# Patient Record
Sex: Female | Born: 1955 | Race: Black or African American | Hispanic: No | Smoking: Former smoker
Health system: Southern US, Community
[De-identification: ages and names within clinical notes are randomized; demographics above are authoritative.]

## PROBLEM LIST (undated history)

## (undated) DIAGNOSIS — I2510 Atherosclerotic heart disease of native coronary artery without angina pectoris: Secondary | ICD-10-CM

## (undated) DIAGNOSIS — I10 Essential (primary) hypertension: Secondary | ICD-10-CM

## (undated) DIAGNOSIS — E785 Hyperlipidemia, unspecified: Secondary | ICD-10-CM

## (undated) DIAGNOSIS — I499 Cardiac arrhythmia, unspecified: Secondary | ICD-10-CM

## (undated) DIAGNOSIS — I509 Heart failure, unspecified: Secondary | ICD-10-CM

## (undated) HISTORY — DX: Atherosclerotic heart disease of native coronary artery without angina pectoris: I25.10

## (undated) HISTORY — DX: Essential (primary) hypertension: I10

## (undated) HISTORY — DX: Heart failure, unspecified: I50.9

## (undated) HISTORY — DX: Cardiac arrhythmia, unspecified: I49.9

## (undated) HISTORY — DX: Hyperlipidemia, unspecified: E78.5

---

## 2017-11-19 HISTORY — PX: BIV ICD INSERTION CRT-D: EP1195

## 2017-12-31 ENCOUNTER — Encounter: Payer: Self-pay | Admitting: Cardiovascular Disease

## 2017-12-31 ENCOUNTER — Ambulatory Visit: Payer: Medicare HMO | Admitting: Cardiovascular Disease

## 2017-12-31 ENCOUNTER — Encounter: Payer: Self-pay | Admitting: *Deleted

## 2017-12-31 VITALS — BP 138/88 | HR 60 | Ht 63.0 in | Wt 211.0 lb

## 2017-12-31 DIAGNOSIS — J449 Chronic obstructive pulmonary disease, unspecified: Secondary | ICD-10-CM | POA: Diagnosis not present

## 2017-12-31 DIAGNOSIS — Z6837 Body mass index (BMI) 37.0-37.9, adult: Secondary | ICD-10-CM

## 2017-12-31 DIAGNOSIS — I10 Essential (primary) hypertension: Secondary | ICD-10-CM

## 2017-12-31 DIAGNOSIS — Z95810 Presence of automatic (implantable) cardiac defibrillator: Secondary | ICD-10-CM

## 2017-12-31 DIAGNOSIS — I69354 Hemiplegia and hemiparesis following cerebral infarction affecting left non-dominant side: Secondary | ICD-10-CM

## 2017-12-31 DIAGNOSIS — I5042 Chronic combined systolic (congestive) and diastolic (congestive) heart failure: Secondary | ICD-10-CM

## 2017-12-31 DIAGNOSIS — E7800 Pure hypercholesterolemia, unspecified: Secondary | ICD-10-CM | POA: Insufficient documentation

## 2017-12-31 MED ORDER — CARVEDILOL 25 MG PO TABS: 25.0000 mg | ORAL_TABLET | Freq: Two times a day (BID) | ORAL | 3 refills | Status: DC

## 2017-12-31 MED ORDER — ATORVASTATIN CALCIUM 40 MG PO TABS: 40.0000 mg | ORAL_TABLET | Freq: Every day | ORAL | 3 refills | Status: DC

## 2017-12-31 MED ORDER — ATORVASTATIN CALCIUM 40 MG PO TABS: 40.0000 mg | ORAL_TABLET | Freq: Every day | ORAL | 0 refills | Status: AC

## 2017-12-31 MED ORDER — HYDRALAZINE HCL 25 MG PO TABS: 25.0000 mg | ORAL_TABLET | Freq: Three times a day (TID) | ORAL | 0 refills | Status: DC

## 2017-12-31 MED ORDER — FUROSEMIDE 40 MG PO TABS: 40.0000 mg | ORAL_TABLET | Freq: Every day | ORAL | 3 refills | Status: DC

## 2017-12-31 MED ORDER — HYDRALAZINE HCL 25 MG PO TABS: 25.0000 mg | ORAL_TABLET | Freq: Three times a day (TID) | ORAL | 3 refills | Status: DC

## 2017-12-31 MED ORDER — CARVEDILOL 25 MG PO TABS: 25.0000 mg | ORAL_TABLET | Freq: Two times a day (BID) | ORAL | 0 refills | Status: DC

## 2017-12-31 MED ORDER — FUROSEMIDE 40 MG PO TABS: 40.0000 mg | ORAL_TABLET | Freq: Every day | ORAL | 0 refills | Status: AC

## 2017-12-31 NOTE — Progress Notes (Signed)
Cardiology consultation Note:    Date:  12/31/2017   ID:  Raven Ross, DOB 06-13-1956, MRN [id number]  PCP:  Renaye Rakers, MD  Cardiologist:  Thurmon Fair, MD   Raven Ross is a 62 y.o. female who is being seen today for the evaluation of congestive heart failure and to establish CRT-D device followup, at the request of Dr. Parke Simmers.   History of Present Illness:    Raven Ross is a 62 y.o. female with a hx of severe cardiomyopathy, congestive heart failure, now roughly 2 months following implantation of a biventricular pacemaker/defibrillator for primary prevention. She has moved to [place] from [place]. Unfortunately we do not yet have any of her medical records from her previous cardiologist, Dr. Kern Alberta. 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 She underwent cardiac catheterization but reportedly did not have any significant coronary atherosclerosis. She was told that she had a "tiny hole in her atrial septum" on TEE and that she may have had a paradoxical embolism. She had severely depressed left ventricular systolic function and an LV apical thrombus. She took Coumadin "for about a year". He is no longer on anticoagulants. She reports having "a couple of TIAs and maybe one stroke": She had expressive aphasia which has resolved and had left-sided weakness; has some very mild residual left upper extremity hemiparesis, mostly a weak grasp. November she was told that her ejection fraction was down to 15% and it was recommended that she receive a biventricular defibrillator. She is not sure whether she had a left bundle branch block, but that sounds vaguely familiar.  The patient specifically denies any chest pain at rest or with usual exertion, dyspnea at rest or with exertion, orthopnea, paroxysmal  nocturnal dyspnea, syncope, palpitations, focal neurological deficits, intermittent claudication, lower extremity edema, unexplained weight gain, cough, hemoptysis or wheezing. Appears to have NYHA functional class I-II status  The patient also denies abdominal pain, nausea, vomiting, dysphagia, diarrhea, constipation, polyuria, polydipsia, dysuria, hematuria, frequency, urgency, abnormal bleeding or bruising, fever, chills, unexpected weight changes, mood swings, change in skin or hair texture, change in voice quality, auditory or visual problems, allergic reactions or rashes, new musculoskeletal complaints other than usual "aches and pains".  She ran out of carvedilol about 2 days ago. She believes that's why her blood pressure is high. She has had problems with hypotension from heart failure medications in the past.  Her device was implanted in December and this is essentially her first follow-up visit since then.  Device is St. Jude Quadra Assura MP model [id number] CRT-D serial number [id number]. The atrial lead is a St. Jude tendril STS 2088 TC/52 cm, serial number [id number]. The right ventricular lead is a St. Jude Durata 7122Q/65 cm serial number [id number]  The left ventricular lead is a St. Jude quartet 1458Q/86 cm, serial number BPP [id number]  RA pacing threshold 0.5 V at 0.5 ms pulse width, sensed P waves greater than 5 mV, impedance 410 ohms RV pacing threshold is 0.5 V at 0.5 ms pulse width, sensed R waves greater than 12 mV, impedance 460 ohms, high-voltage impedance 54 ohms LV pacing threshold was changed today to auto capture. Previously reported at 2.25 V at 0.5 ms pulse width similar value detected today (configuration M2-RV coil). LV lead impedance 660 ohms  The generator is at beginning  of life and (after adjustment in pacemaker lead outputs today) the anticipated generator longevity is around 5 years. The device is programmed DDD with a lower rate limit of 60 bpm and a max sensor rate  of 120/max track rate of 120 bpm. Paced/sensed AV delays are scheduled at 150/100 ms. She has 98% ventricular pacing. There is also 36% atrial pacing with a favorable heart rate histogram distribution.   Tachycardia detection is set in 2 zones, a VT zone at 181 bpm and a VF zone at 200 bpm. VT detection is set for conservatively at 40 intervals were as VF detection set at 20 intervals. 2 runs of ATP programmed in the VT zone followed by shocks. In the VF zone there is ATP during charge is followed by shock. SVT discrimination appears to be programmed appropriately.  There has been no recording of atrial fibrillation and no ventricular tachycardia. The thoracic impedance monitoring was turned on at this visit.  Additional noncardiac problems include a history of hepatitis C,  "cured" with harvoni.  She is a widow after the death of her first husband in 2008, but has a "significant other" for the last 10 years and he has moved with her to [place]. 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 She is interested in going back to work at least part-time.  Past Medical History:  Diagnosis Date  . Arrhythmia   . CHF (congestive heart failure) (HCC)   . Coronary artery disease   . Hyperlipidemia   . Hypertension     Past Surgical History:  Procedure Laterality Date  . BIV ICD INSERTION CRT-D Left 11/19/2017   [place], Dr. Kern AlbertaSwitzer    Current Medications: Current Meds  Medication Sig  . aspirin EC 81 MG tablet Take 81 mg by mouth daily.  Marland Kitchen. atorvastatin (LIPITOR) 40 MG tablet Take 1 tablet (40 mg total) by mouth daily.  . budesonide-formoterol (SYMBICORT) 160-4.5 MCG/ACT inhaler Inhale 2 puffs into the lungs 2 (two) times daily.  . carvedilol (COREG) 25 MG tablet Take 1 tablet (25 mg total) by mouth 2 (two) times daily with a meal.  . furosemide (LASIX) 40 MG tablet Take 1 tablet (40 mg total) by mouth daily.  . hydrALAZINE (APRESOLINE) 25 MG tablet Take 1 tablet (25 mg total)  by mouth 3 (three) times daily.  . Isosorbide Mononitrate (IMDUR PO) Take 10 mg by mouth 3 (three) times daily.   . sacubitril-valsartan (ENTRESTO) 24-26 MG Take 1 tablet by mouth 2 (two) times daily.  . [DISCONTINUED] atorvastatin (LIPITOR) 40 MG tablet Take 40 mg by mouth daily.  . [DISCONTINUED] atorvastatin (LIPITOR) 40 MG tablet Take 1 tablet (40 mg total) by mouth daily.  . [DISCONTINUED] carvedilol (COREG) 25 MG tablet Take 25 mg by mouth 2 (two) times daily with a meal.  . [DISCONTINUED] carvedilol (COREG) 25 MG tablet Take 1 tablet (25 mg total) by mouth 2 (two) times daily with a meal.  . [DISCONTINUED] furosemide (LASIX) 40 MG tablet Take 40 mg by mouth daily.  . [DISCONTINUED] furosemide (LASIX) 40 MG tablet Take 1 tablet (40 mg total) by mouth daily.  . [DISCONTINUED] hydrALAZINE (APRESOLINE) 25 MG tablet Take 25 mg by mouth 3 (three) times daily.  . [DISCONTINUED] hydrALAZINE (APRESOLINE) 25 MG tablet Take 1 tablet (25 mg total) by mouth 3 (three) times daily.     Allergies:   Patient has no known allergies.   Social History   Socioeconomic History  . Marital status:  Significant Other    Spouse name: None  . Number of children: None  . Years of education: None  . Highest education level: None  Social Needs  . Financial resource strain: None  . Food insecurity - worry: None  . Food insecurity - inability: None  . Transportation needs - medical: None  . Transportation needs - non-medical: None  Occupational History  . None  Tobacco Use  . Smoking status: Former Games developer  . Smokeless tobacco: Never Used  Substance and Sexual Activity  . Alcohol use: No    Frequency: Never  . Drug use: No  . Sexual activity: None  Other Topics Concern  . None  Social History Narrative  . None     Family History: The patient's family history includes COPD in her father and mother; Dementia in her mother; Diabetes in her father; Emphysema in her father.  ROS:   Please see the  history of present illness.     All other systems reviewed and are negative.  EKGs/Labs/Other Studies Reviewed:    EKG:  EKG is  ordered today.  The ekg ordered today demonstrates atrial paced, biventricular paced rhythm. QRS on and 22 ms with fairly brisk sharp R waves in leads V1-V2, QTC 490 ms  Recent Labs: No results found for requested labs within last 8760 hours.  Recent Lipid Panel No results found for: CHOL, TRIG, HDL, CHOLHDL, VLDL, LDLCALC, LDLDIRECT  Physical Exam:    VS:  BP 138/88   Pulse 60   Ht 5\' 3"  (1.6 m)   Wt 211 lb (95.7 kg)   BMI 37.38 kg/m     Wt Readings from Last 3 Encounters:  12/31/17 211 lb (95.7 kg)     GEN: Moderately obese Well nourished, well developed in no acute distress HEENT: Normal NECK: No JVD; No carotid bruits LYMPHATICS: No lymphadenopathy CARDIAC: Paradoxically split second heart sound, RRR, no murmurs, rubs, gallops, healthy healed scar in the left subclavian area, healthy defibrillator pocket RESPIRATORY:  Clear to auscultation without rales, wheezing or rhonchi  ABDOMEN: Soft, non-tender, non-distended MUSCULOSKELETAL:  No edema; No deformity  SKIN: Warm and dry NEUROLOGIC:  Alert and oriented x 3. Just barely detectable difference in grasp strength between her right upper extremity praxis stronger and left upper extremity (a little weaker). PSYCHIATRIC:  Normal affect   ASSESSMENT:    1. Chronic combined systolic and diastolic congestive heart failure (HCC)   2. Biventricular ICD (implantable cardioverter-defibrillator) St. Jude   3. Essential hypertension   4. Hemiparesis affecting left side as late effect of cerebrovascular accident (HCC)   5. Class 2 severe obesity due to excess calories with serious comorbidity and body mass index (BMI) of 37.0 to 37.9 in adult (HCC)   6. Hypercholesterolemia   7. Chronic obstructive pulmonary disease, unspecified COPD type (HCC)    PLAN:    In order of problems listed above:  1. CHF:  Seems to have functional class I-II status. Clinically euvolemic as far as I can tell. Uncertain what her current "dry weight" is. She has not weight at home on a regular basis since moving from [place]. She admits to having a weakness first salt but understands the need to avoid added salt and salt rich foods. Recommended that she start daily weights. Her current medical regimen is appropriate, but I told her that she needs to be cautious in the future to avoid "running out" of her heart failure medications and that this is particularly true of carvedilol.  His [place] risk of rebound arrhythmia/hypertension/heart failure with abrupt interruption of beta blockers. We'll continue this current dose of loop diuretic at least for the time being. After she restarts her carvedilol will bring her back to see one of our clinical pharmacists to try to titrate up the dose of Entresto. If necessary we will discontinue her hydralazine and isosorbide, since I believe the benefit from [facility] is greater. Plan to recheck an echocardiogram in about 3 months, after CRT and optimization of medical therapy. Refilled all prescriptions today. 2. CRT-D: Reduced the output on her right atrial and right ventricular leads to 2.0 V at 0.5 ms today. Turned auto capture on her left ventricular lead. Corvue also turned on. Reviewed her "shock plan". Demonstrated the device vibratory alert  Plan remote monitoring and will refer to device clinic. She does not have a transmitter and we will contact industry to obtain one. Will bring her back to the device clinic in 3 months first to be sure we have good follow-up and to see if there is any option for optimizing left ventricular lead output. I'm not sure why a VT zone with therapy was programmed in this patient. It's possible that she had some previously detected VT. We'll leave the tachycardia detection settings and treatment settings unchanged until we get some medical records. 3. HTN: Blood  pressure slightly high today, but she reports problems with hypotension when her medicines are being titrated before. Prioritize use of carvedilol and Entresto. 4. History of CVA: I'm a little puzzled by the decision to stop the use of anticoagulation in a patient that has reportedly had a few neurological embolic events, has severely depressed left ventricular systolic function and a remote history of left ventricular thrombus, especially since there is also a mention of a paradoxical embolism as a possible cause for her initial myocardial infarction. Need to get her previous medical records before making a decision. 5. Obesity: Discussed the rather paradoxical relationship between obesity and heart failure, but I believe that she would benefit from weight loss. She does not have signs or symptoms of obstructive sleep apnea. 6. HLP: On statin. She plans to have all her labs performed at an upcoming appointment with Dr. Parke Simmers. 7. COPD: She does have a history of remote smoking. She has a prescription for Symbicort, although there is no clear diagnosis of asthma. We discussed the fact that this medication should never be taken more than the prescribed amount and should not be used as a rescue inhaler. Reviewed the potential worsening risk for ventricular arrhythmia.   Medication Adjustments/Labs and Tests Ordered: Current medicines are reviewed at length with the patient today.  Concerns regarding medicines are outlined above.  Orders Placed This Encounter  Procedures  . EKG 12-Lead  . ECHOCARDIOGRAM COMPLETE   Meds ordered this encounter  Medications  . DISCONTD: atorvastatin (LIPITOR) 40 MG tablet    Sig: Take 1 tablet (40 mg total) by mouth daily.    Dispense:  90 tablet    Refill:  3  . DISCONTD: carvedilol (COREG) 25 MG tablet    Sig: Take 1 tablet (25 mg total) by mouth 2 (two) times daily with a meal.    Dispense:  180 tablet    Refill:  3  . DISCONTD: furosemide (LASIX) 40 MG tablet     Sig: Take 1 tablet (40 mg total) by mouth daily.    Dispense:  90 tablet    Refill:  3  . DISCONTD: hydrALAZINE (APRESOLINE) 25  MG tablet    Sig: Take 1 tablet (25 mg total) by mouth 3 (three) times daily.    Dispense:  270 tablet    Refill:  3  . atorvastatin (LIPITOR) 40 MG tablet    Sig: Take 1 tablet (40 mg total) by mouth daily.    Dispense:  30 tablet    Refill:  0  . carvedilol (COREG) 25 MG tablet    Sig: Take 1 tablet (25 mg total) by mouth 2 (two) times daily with a meal.    Dispense:  60 tablet    Refill:  0  . furosemide (LASIX) 40 MG tablet    Sig: Take 1 tablet (40 mg total) by mouth daily.    Dispense:  30 tablet    Refill:  0  . hydrALAZINE (APRESOLINE) 25 MG tablet    Sig: Take 1 tablet (25 mg total) by mouth 3 (three) times daily.    Dispense:  90 tablet    Refill:  0    Signed, Thurmon Fair, MD  12/31/2017 8:10 PM    [facility]

## 2017-12-31 NOTE — Patient Instructions (Signed)
Dr Royann Shiversroitoru recommends that you continue on your current medications as directed. Please refer to the Current Medication list given to you today.  Your physician recommends that you schedule an appointment in 1-2 weeks with one of our clinical pharmacists for medication managment.  Your physician has requested that you have an echocardiogram. Echocardiography is a painless test that uses sound waves to create images of your heart. It provides your doctor with information about the size and shape of your heart and how well your heart's chambers and valves are working. This procedure takes approximately one hour. There are no restrictions for this procedure. >>[facility] [address], [address] [address]  Your physician recommends that you schedule a follow-up appointment in 3 months at [facility]. I will have the nurses mail you a transmitter  Dr Royann Shiversroitoru recommends that you schedule a follow-up appointment in 6 months with an ICD check. You will receive a reminder letter in the mail two months in advance. If you don't receive a letter, please call our office to schedule the follow-up appointment.  If you need a refill on your cardiac medications before your next appointment, please call your pharmacy.

## 2018-01-03 DIAGNOSIS — I509 Heart failure, unspecified: Secondary | ICD-10-CM | POA: Diagnosis not present

## 2018-01-03 DIAGNOSIS — E118 Type 2 diabetes mellitus with unspecified complications: Secondary | ICD-10-CM | POA: Diagnosis not present

## 2018-01-14 ENCOUNTER — Telehealth: Payer: Self-pay | Admitting: Cardiovascular Disease

## 2018-01-14 NOTE — Telephone Encounter (Signed)
Received incoming records from [facility] Starr SinclairKenneth L. Gayles MD. Records placed in Dr. Erin Hearingroitoru's box.

## 2018-01-16 DIAGNOSIS — F5102 Adjustment insomnia: Secondary | ICD-10-CM | POA: Diagnosis not present

## 2018-01-16 DIAGNOSIS — F3289 Other specified depressive episodes: Secondary | ICD-10-CM | POA: Diagnosis not present

## 2018-01-16 DIAGNOSIS — I509 Heart failure, unspecified: Secondary | ICD-10-CM | POA: Diagnosis not present

## 2018-01-20 ENCOUNTER — Telehealth: Payer: Self-pay | Admitting: Cardiovascular Disease

## 2018-01-20 ENCOUNTER — Other Ambulatory Visit: Payer: Self-pay | Admitting: Cardiovascular Disease

## 2018-01-20 MED ORDER — ISOSORBIDE DINITRATE 10 MG PO TABS: 10.0000 mg | ORAL_TABLET | Freq: Three times a day (TID) | ORAL | 1 refills | Status: DC

## 2018-01-20 NOTE — Telephone Encounter (Signed)
Rx request sent to pharmacy.  

## 2018-01-20 NOTE — Telephone Encounter (Signed)
Isosorbide dinitrate 10 mg TID is preferable in CHF, but can be more expensive.  If cost is prohibitive, OK to switch to isosorbide mononitrate 30 mg daily MCr

## 2018-01-20 NOTE — Telephone Encounter (Signed)
Rx(s) sent to pharmacy electronically.  

## 2018-01-20 NOTE — Telephone Encounter (Signed)
°*  STAT* If patient is at the pharmacy, call can be transferred to refill team.   1. Which medications need to be refilled? (please list name of each medication and dose if known) Isosorbide  2. Which pharmacy/location (including street and city if local pharmacy) is medication to be sent to?[facility],[place]  3. Do they need a 30 day or 90 day supply?#90-for a month supply until [facility]

## 2018-02-12 ENCOUNTER — Other Ambulatory Visit (HOSPITAL_COMMUNITY): Payer: Medicare HMO

## 2018-02-23 ENCOUNTER — Encounter (HOSPITAL_COMMUNITY): Payer: Self-pay | Admitting: Oncology

## 2018-02-23 ENCOUNTER — Emergency Department (HOSPITAL_COMMUNITY)
Admission: EM | Admit: 2018-02-23 | Discharge: 2018-02-24 | Disposition: A | Discharge status: Home / Self Care | Payer: Medicare HMO | Attending: Emergency Medicine | Admitting: Emergency Medicine

## 2018-02-23 DIAGNOSIS — I5042 Chronic combined systolic (congestive) and diastolic (congestive) heart failure: Secondary | ICD-10-CM | POA: Insufficient documentation

## 2018-02-23 DIAGNOSIS — Z95810 Presence of automatic (implantable) cardiac defibrillator: Secondary | ICD-10-CM | POA: Insufficient documentation

## 2018-02-23 DIAGNOSIS — Z7982 Long term (current) use of aspirin: Secondary | ICD-10-CM | POA: Diagnosis not present

## 2018-02-23 DIAGNOSIS — R000 Tachycardia, unspecified: Secondary | ICD-10-CM | POA: Diagnosis not present

## 2018-02-23 DIAGNOSIS — R0602 Shortness of breath: Secondary | ICD-10-CM | POA: Diagnosis not present

## 2018-02-23 DIAGNOSIS — R05 Cough: Secondary | ICD-10-CM | POA: Diagnosis not present

## 2018-02-23 DIAGNOSIS — R600 Localized edema: Secondary | ICD-10-CM | POA: Diagnosis not present

## 2018-02-23 DIAGNOSIS — I259 Chronic ischemic heart disease, unspecified: Secondary | ICD-10-CM | POA: Diagnosis not present

## 2018-02-23 DIAGNOSIS — R0600 Dyspnea, unspecified: Secondary | ICD-10-CM

## 2018-02-23 DIAGNOSIS — J441 Chronic obstructive pulmonary disease with (acute) exacerbation: Secondary | ICD-10-CM | POA: Insufficient documentation

## 2018-02-23 DIAGNOSIS — Z720 Tobacco use: Secondary | ICD-10-CM | POA: Diagnosis not present

## 2018-02-23 DIAGNOSIS — I110 Hypertensive heart disease with heart failure: Secondary | ICD-10-CM | POA: Diagnosis not present

## 2018-02-23 DIAGNOSIS — R69 Illness, unspecified: Secondary | ICD-10-CM

## 2018-02-23 DIAGNOSIS — M7989 Other specified soft tissue disorders: Secondary | ICD-10-CM | POA: Diagnosis not present

## 2018-02-23 DIAGNOSIS — F99 Mental disorder, not otherwise specified: Secondary | ICD-10-CM | POA: Diagnosis not present

## 2018-02-23 DIAGNOSIS — Z79899 Other long term (current) drug therapy: Secondary | ICD-10-CM | POA: Diagnosis not present

## 2018-02-23 DIAGNOSIS — K429 Umbilical hernia without obstruction or gangrene: Secondary | ICD-10-CM | POA: Insufficient documentation

## 2018-02-23 MED ORDER — ALBUTEROL SULFATE (2.5 MG/3ML) 0.083% IN NEBU: 5.0000 mg | INHALATION_SOLUTION | Freq: Once | RESPIRATORY_TRACT | Status: DC

## 2018-02-23 NOTE — ED Triage Notes (Signed)
Pt bib [facility] from home d/t shob x 4 days.  Pt reported developing a cough w/ clear sputum that has gotten worse today.  Pt reported to EMS that when this has happened in the past she has needed a paracentesis.  EMS reports that abdomen is distended.  Pt placed on 2L O2 by EMS for comfort.

## 2018-02-24 ENCOUNTER — Other Ambulatory Visit (HOSPITAL_COMMUNITY): Payer: Medicare HMO

## 2018-02-24 ENCOUNTER — Encounter (HOSPITAL_COMMUNITY): Payer: Self-pay | Admitting: Radiology

## 2018-02-24 ENCOUNTER — Emergency Department (HOSPITAL_COMMUNITY): Payer: Medicare HMO

## 2018-02-24 DIAGNOSIS — R0602 Shortness of breath: Secondary | ICD-10-CM | POA: Diagnosis not present

## 2018-02-24 DIAGNOSIS — K429 Umbilical hernia without obstruction or gangrene: Secondary | ICD-10-CM | POA: Diagnosis not present

## 2018-02-24 DIAGNOSIS — R05 Cough: Secondary | ICD-10-CM | POA: Diagnosis not present

## 2018-02-24 LAB — BASIC METABOLIC PANEL
Anion gap: 7 (ref 5–15)
BUN: 21 mg/dL — AB (ref 6–20)
CHLORIDE: 109 mmol/L (ref 101–111)
CO2: 24 mmol/L (ref 22–32)
CREATININE: 1.03 mg/dL — AB (ref 0.44–1.00)
Calcium: 8.5 mg/dL — ABNORMAL LOW (ref 8.9–10.3)
GFR calc Af Amer: >60 mL/min (ref >60)
GFR, EST NON AFRICAN AMERICAN: 58 mL/min — AB (ref >60)
Glucose, Bld: 107 mg/dL — ABNORMAL HIGH (ref 65–99)
POTASSIUM: 4.8 mmol/L (ref 3.5–5.1)
SODIUM: 140 mmol/L (ref 135–145)

## 2018-02-24 LAB — CBC
HCT: 41.5 % (ref 36.0–46.0)
HEMOGLOBIN: 13 g/dL (ref 12.0–15.0)
MCH: 32.8 pg (ref 26.0–34.0)
MCHC: 31.3 g/dL (ref 30.0–36.0)
MCV: 104.8 fL — ABNORMAL HIGH (ref 78.0–100.0)
PLATELETS: 121 10*3/uL — AB (ref 150–400)
RBC: 3.96 MIL/uL (ref 3.87–5.11)
RDW: 15 % (ref 11.5–15.5)
WBC: 4.6 10*3/uL (ref 4.0–10.5)

## 2018-02-24 LAB — I-STAT TROPONIN, ED: Troponin i, poc: 0 ng/mL (ref 0.00–0.08)

## 2018-02-24 LAB — HEPATIC FUNCTION PANEL
ALBUMIN: 3.3 g/dL — AB (ref 3.5–5.0)
ALT: 18 U/L (ref 14–54)
ALT: 20 U/L (ref 14–54)
AST: 25 U/L (ref 15–41)
AST: 29 U/L (ref 15–41)
Albumin: 3.4 g/dL — ABNORMAL LOW (ref 3.5–5.0)
Alkaline Phosphatase: 55 U/L (ref 38–126)
Alkaline Phosphatase: 62 U/L (ref 38–126)
BILIRUBIN INDIRECT: 0.4 mg/dL (ref 0.3–0.9)
BILIRUBIN INDIRECT: 0.4 mg/dL (ref 0.3–0.9)
Bilirubin, Direct: 0.3 mg/dL (ref 0.1–0.5)
Bilirubin, Direct: 0.7 mg/dL — ABNORMAL HIGH (ref 0.1–0.5)
TOTAL PROTEIN: 6.2 g/dL — AB (ref 6.5–8.1)
TOTAL PROTEIN: 6.5 g/dL (ref 6.5–8.1)
Total Bilirubin: 0.7 mg/dL (ref 0.3–1.2)
Total Bilirubin: 1.1 mg/dL (ref 0.3–1.2)

## 2018-02-24 LAB — PROTIME-INR
INR: 1.01
PROTHROMBIN TIME: 13.3 s (ref 11.4–15.2)

## 2018-02-24 LAB — BRAIN NATRIURETIC PEPTIDE: B NATRIURETIC PEPTIDE 5: 251.5 pg/mL — AB (ref 0.0–100.0)

## 2018-02-24 LAB — AMMONIA: Ammonia: 53 umol/L — ABNORMAL HIGH (ref 9–35)

## 2018-02-24 LAB — I-STAT CG4 LACTIC ACID, ED: LACTIC ACID, VENOUS: 0.92 mmol/L (ref 0.5–1.9)

## 2018-02-24 MED ORDER — ISOSORBIDE DINITRATE 10 MG PO TABS
10.0000 mg | ORAL_TABLET | Freq: Once | ORAL | Status: AC
  Administered 2018-02-24: 10 mg via ORAL
  Filled 2018-02-24: qty 1

## 2018-02-24 MED ORDER — ASPIRIN 81 MG PO CHEW
81.0000 mg | CHEWABLE_TABLET | Freq: Once | ORAL | Status: AC
  Administered 2018-02-24: 81 mg via ORAL
  Filled 2018-02-24: qty 1

## 2018-02-24 MED ORDER — PREDNISONE 20 MG PO TABS
40.0000 mg | ORAL_TABLET | Freq: Once | ORAL | Status: AC
Start: 2018-02-24 — End: 2018-02-24
  Administered 2018-02-24: 40 mg via ORAL
  Filled 2018-02-24: qty 2

## 2018-02-24 MED ORDER — AEROCHAMBER PLUS W/MASK MISC: 2 refills | Status: AC

## 2018-02-24 MED ORDER — HYDRALAZINE HCL 25 MG PO TABS
25.0000 mg | ORAL_TABLET | Freq: Once | ORAL | Status: AC
  Administered 2018-02-24: 25 mg via ORAL
  Filled 2018-02-24: qty 1

## 2018-02-24 MED ORDER — IPRATROPIUM BROMIDE HFA 17 MCG/ACT IN AERS: 2.0000 | INHALATION_SPRAY | Freq: Four times a day (QID) | RESPIRATORY_TRACT | 12 refills | Status: AC | PRN

## 2018-02-24 MED ORDER — IPRATROPIUM-ALBUTEROL 0.5-2.5 (3) MG/3ML IN SOLN
3.0000 mL | Freq: Once | RESPIRATORY_TRACT | Status: AC
  Administered 2018-02-24: 3 mL via RESPIRATORY_TRACT
  Filled 2018-02-24: qty 3

## 2018-02-24 MED ORDER — PREDNISONE 20 MG PO TABS: ORAL_TABLET | ORAL | 0 refills | Status: DC

## 2018-02-24 MED ORDER — FUROSEMIDE 10 MG/ML IJ SOLN
60.0000 mg | Freq: Once | INTRAMUSCULAR | Status: AC
  Administered 2018-02-24: 60 mg via INTRAVENOUS
  Filled 2018-02-24: qty 6

## 2018-02-24 MED ORDER — SACUBITRIL-VALSARTAN 24-26 MG PO TABS
1.0000 | ORAL_TABLET | Freq: Once | ORAL | Status: AC
  Administered 2018-02-24: 1 via ORAL
  Filled 2018-02-24: qty 1

## 2018-02-24 MED ORDER — ALBUTEROL SULFATE HFA 108 (90 BASE) MCG/ACT IN AERS: 1.0000 | INHALATION_SPRAY | Freq: Four times a day (QID) | RESPIRATORY_TRACT | 0 refills | Status: AC | PRN

## 2018-02-24 MED ORDER — IOPAMIDOL (ISOVUE-370) INJECTION 76%
INTRAVENOUS | Status: AC
  Administered 2018-02-24: 100 mL
  Filled 2018-02-24: qty 100

## 2018-02-24 MED ORDER — CARVEDILOL 12.5 MG PO TABS
25.0000 mg | ORAL_TABLET | Freq: Two times a day (BID) | ORAL | Status: DC
  Administered 2018-02-24: 25 mg via ORAL
  Filled 2018-02-24: qty 2

## 2018-02-24 NOTE — ED Notes (Signed)
CT called and advised that the pts creatine had not resulted.   Contacted the lab and they said they "lost" her blood and could not run what they had down there for her.

## 2018-02-24 NOTE — ED Notes (Signed)
Heart Healthy Breakfast Ordered @ 0942-per RN-called by Marylene LandAngela

## 2018-02-24 NOTE — ED Notes (Signed)
Patient transported to X-ray 

## 2018-02-24 NOTE — ED Notes (Signed)
Patient ambulated to restroom without difficulty. No respiratory distress noted.

## 2018-02-24 NOTE — ED Notes (Signed)
Patient transported to CT 

## 2018-02-24 NOTE — ED Provider Notes (Signed)
[facility] EMERGENCY DEPARTMENT Provider Note   CSN: [id number] Arrival date & time: 02/23/18  2351     History   Chief Complaint Chief Complaint  Patient presents with  . Shortness of Breath    HPI Raven Ross is a 62 y.o. female.  Patient presents to the emergency department for evaluation of shortness of breath.  She reports that she has had progressive worsening of her breathing over the last 3 or 4 days.  This has been in conjunction with a productive cough.  She is unaware of any fever.  She has not experience any chest pain.  Patient reports a previous history of heart failure, does not weigh herself daily, but does feel like she is more swollen than usual.      Past Medical History:  Diagnosis Date  . Arrhythmia   . CHF (congestive heart failure) (HCC)   . Coronary artery disease   . Hyperlipidemia   . Hypertension     Patient Active Problem List   Diagnosis Date Noted  . Chronic combined systolic and diastolic congestive heart failure (HCC) 12/31/2017  . Biventricular ICD (implantable cardioverter-defibrillator) [employer] Jude 12/31/2017  . Essential hypertension 12/31/2017  . Hemiparesis affecting left side as late effect of cerebrovascular accident (HCC) 12/31/2017  . Class 2 severe obesity due to excess calories with serious comorbidity and body mass index (BMI) of 37.0 to 37.9 in adult (HCC) 12/31/2017  . Hypercholesterolemia 12/31/2017  . Chronic obstructive pulmonary disease (HCC) 12/31/2017    Past Surgical History:  Procedure Laterality Date  . BIV ICD INSERTION CRT-D Left 11/19/2017   [place], [place], Dr. Kern Alberta    OB History    No data available       Home Medications    Prior to Admission medications   Medication Sig Start Date End Date Taking? Authorizing Provider  aspirin EC 81 MG tablet Take 81 mg by mouth daily.   Yes [provider]  atorvastatin (LIPITOR) 40 MG tablet Take 1 tablet (40 mg total) by mouth  daily. 12/31/17  Yes Croitoru, Mihai, MD  budesonide-formoterol (SYMBICORT) 160-4.5 MCG/ACT inhaler Inhale 2 puffs into the lungs 2 (two) times daily.   Yes [provider]  carvedilol (COREG) 25 MG tablet Take 1 tablet (25 mg total) by mouth 2 (two) times daily with a meal. 12/31/17  Yes Croitoru, Mihai, MD  furosemide (LASIX) 40 MG tablet Take 1 tablet (40 mg total) by mouth daily. 12/31/17  Yes Croitoru, Mihai, MD  hydrALAZINE (APRESOLINE) 25 MG tablet Take 1 tablet (25 mg total) by mouth 3 (three) times daily. 12/31/17  Yes Croitoru, Mihai, MD  isosorbide dinitrate (ISORDIL) 10 MG tablet TAKE 1 TABLET BY MOUTH THREE TIMES DAILY 01/20/18  Yes Croitoru, Mihai, MD  PROAIR HFA 108 (90 Base) MCG/ACT inhaler Inhale 1-2 puffs into the lungs every 4 (four) hours as needed for wheezing.  12/31/17  Yes [provider]  sacubitril-valsartan (ENTRESTO) 24-26 MG Take 1 tablet by mouth 2 (two) times daily.   Yes [provider]    Family History Family History  Problem Relation Age of Onset  . COPD Mother   . Dementia Mother   . COPD Father   . Emphysema Father   . Diabetes Father     Social History Social History   Tobacco Use  . Smoking status: Former Games developer  . Smokeless tobacco: Never Used  Substance Use Topics  . Alcohol use: No    Frequency: Never  .  Drug use: No     Allergies   Patient has no known allergies.   Review of Systems Review of Systems  Respiratory: Positive for shortness of breath.   Cardiovascular: Positive for leg swelling.  All other systems reviewed and are negative.    Physical Exam Updated Vital Signs BP 129/82   Pulse 77   Temp (!) 97.5 F (36.4 C) (Oral)   Resp 19   Ht 5\' 3"  (1.6 m)   Wt 93.4 kg (206 lb)   SpO2 91%   BMI 36.49 kg/m   Physical Exam  Constitutional: She is oriented to person, place, and time. She appears well-developed and well-nourished. No distress.  HENT:  Head: Normocephalic and atraumatic.  Right  Ear: Hearing normal.  Left Ear: Hearing normal.  Nose: Nose normal.  Mouth/Throat: Oropharynx is clear and moist and mucous membranes are normal.  Eyes: Conjunctivae and EOM are normal. Pupils are equal, round, and reactive to light.  Neck: Normal range of motion. Neck supple.  Cardiovascular: Regular rhythm, S1 normal and S2 normal. Exam reveals no gallop and no friction rub.  No murmur heard. Pulmonary/Chest: Effort normal. No respiratory distress. She has decreased breath sounds. She exhibits no tenderness.  Abdominal: Soft. Normal appearance and bowel sounds are normal. There is no hepatosplenomegaly. There is no tenderness. There is no rebound, no guarding, no tenderness at McBurney's point and negative Murphy's sign. No hernia.  Musculoskeletal: Normal range of motion.  Neurological: She is alert and oriented to person, place, and time. She has normal strength. No cranial nerve deficit or sensory deficit. Coordination normal. GCS eye subscore is 4. GCS verbal subscore is 5. GCS motor subscore is 6.  Skin: Skin is warm, dry and intact. No rash noted. No cyanosis.  Psychiatric: She has a normal mood and affect. Her speech is normal and behavior is normal. Thought content normal.  Nursing note and vitals reviewed.    ED Treatments / Results  Labs (all labs ordered are listed, but only abnormal results are displayed) Labs Reviewed  AMMONIA - Abnormal; Notable for the following components:      Result Value   Ammonia 53 (*)    All other components within normal limits  HEPATIC FUNCTION PANEL - Abnormal; Notable for the following components:   Albumin 3.4 (*)    Bilirubin, Direct 0.7 (*)    All other components within normal limits  BRAIN NATRIURETIC PEPTIDE - Abnormal; Notable for the following components:   B Natriuretic Peptide 251.5 (*)    All other components within normal limits  CBC - Abnormal; Notable for the following components:   MCV 104.8 (*)    Platelets 121 (*)     All other components within normal limits  BASIC METABOLIC PANEL - Abnormal; Notable for the following components:   Glucose, Bld 107 (*)    BUN 21 (*)    Creatinine, Ser 1.03 (*)    Calcium 8.5 (*)    GFR calc non Af Amer 58 (*)    All other components within normal limits  PROTIME-INR  I-STAT TROPONIN, ED  I-STAT CG4 LACTIC ACID, ED    EKG  EKG Interpretation  Date/Time:  Sunday February 23 2018 23:55:23 EDT Ventricular Rate:  65 PR Interval:    QRS Duration: 240 QT Interval:  510 QTC Calculation: 531 R Axis:   -172 Text Interpretation:  Sinus or ectopic atrial rhythm Prolonged PR interval Nonspecific intraventricular conduction delay Repol abnrm, severe global ischemia (LM/MVD) No previous  tracing Confirmed by Gilda CreasePollina, Shiniqua Groseclose J [phone]) on 02/24/2018 12:14:41 AM Also confirmed by Gilda CreasePollina, Kayse Puccini J [phone]), editor Barbette HairCassel, Kerry [phone])  on 02/24/2018 7:18:48 AM       Radiology Dg Chest 2 View  Result Date: 02/24/2018 CLINICAL DATA:  Acute onset of productive cough. Shortness of breath. Abdominal distention. EXAM: CHEST - 2 VIEW COMPARISON:  None. FINDINGS: The lungs are well-aerated and clear. There is no evidence of focal opacification, pleural effusion or pneumothorax. The heart is mildly enlarged. A pacemaker/AICD is noted at the left chest wall, with leads ending at the right atrium, right ventricle and coronary sinus. No acute osseous abnormalities are seen. IMPRESSION: Mild cardiomegaly.  Lungs remain grossly clear. Electronically Signed   By: Roanna RaiderJeffery  Chang M.D.   On: 02/24/2018 00:23    Procedures Procedures (including critical care time)  Medications Ordered in ED Medications  iopamidol (ISOVUE-370) 76 % injection (not administered)  furosemide (LASIX) injection 60 mg (not administered)  carvedilol (COREG) tablet 25 mg (not administered)  hydrALAZINE (APRESOLINE) tablet 25 mg (not administered)  isosorbide dinitrate (ISORDIL) tablet 10 mg (not administered)    sacubitril-valsartan (ENTRESTO) 24-26 mg per tablet (not administered)     Initial Impression / Assessment and Plan / ED Course  I have reviewed the triage vital signs and the nursing notes.  Pertinent labs & imaging results that were available during my care of the patient were reviewed by me and considered in my medical decision making (see chart for details).     Presents to the emergency department for evaluation of dyspnea.  Patient reports that she has had progressively worsening difficulty breathing over a period of a few days.  She does have a history of heart failure, does not regularly weigh herself.  She is not sure if she has been holding onto volume, but does feel swollen.  She is not experiencing any chest pain.  Patient has been taking her medications as prescribed.  Initial workup does not explain her dyspnea.  BNP is only 251 and her chest is clear to auscultation and on x-ray.  She does use some breathing medicines at home, but is not wheezing or experiencing any active bronchospasm currently.  She is not febrile, no pneumonia.  Will obtain CT angiography of chest to further evaluate.  Patient was reported (by EMS) to have a history of ascites requiring paracentesis for dyspnea in the past.  It is not clear where this information came from, patient does not think she has ever had a paracentesis.  I am not sure if EMS had discussions with the family that know her past history or not, therefore will perform CT abdomen and pelvis to further evaluate.  She does not have any tenderness to palpation or significant tense distention of her abdomen at this time.  We will give home morning medications and additional diuresis with IV Lasix empirically.  Will sign out to oncoming ER provider to follow up on CT scans.  Final Clinical Impressions(s) / ED Diagnoses   Final diagnoses:  Dyspnea, unspecified type    ED Discharge Orders    None       Gilda CreasePollina, Corinda Ammon J, MD 02/24/18  [phone]

## 2018-02-24 NOTE — Discharge Instructions (Signed)
1.  Go for your scheduled echocardiogram this afternoon as planned. 2.  Shortness of breath may be due to COPD.  You need to quit smoking.  Take short course of prednisone as prescribed.  Use albuterol and Atrovent inhalers every 6 hours for the next 3 days.  Then you may use them as needed. 3.  Is very important that you have a family doctor as well as her cardiologist.  Schedule a follow-up as soon as possible. 4.  Return to the emergency department if her symptoms are worsening or changing.

## 2018-02-24 NOTE — ED Provider Notes (Signed)
(  07:25) Accepted at sign out from Dr. Blinda LeatherwoodPollina. Pt c/o dyspnea, no CP.  Awaiting CT PE and abdomen, repeat troponin. Review results and reassess for disposition. (09:00) reviewed CT results with patient.  Patient reports that she has been getting increasingly short of breath over past 3 days.  This is with exertion.  Patient notes that now walking to her bathroom more usual activities in her house makes her short of breath.  She reports that she has been given inhalers in the past but they do not seem to help much so she does not use them.  She denies known history of COPD but does report she is a continuous smoker.  Patient has echocardiogram scheduled today at 1 o'clock.  [place] recently moved from [place] but is now established with Dr. Royann Shiversroitoru.  On examination patient is alert and appropriate.  Mild increased work of breathing.  Heart regular no rub murmur gallop.  Lungs with soft breath sounds.  With forced expiration, fine expiratory wheezes present.  Abdomen is soft.  She has a large nonincarcerated umbilical hernia.  No lower extremity edema or calf tenderness.  Skin condition of lower extremity is good without cellulitis or signs of chronic venous stasis.  She had some improvement with DuoNeb.  She ambulated to the bathroom and back to her room.  She does have mild to moderate increased work of breathing with ambulation but oxygen saturation remained steady at 97-98%.  At this time I have higher suspicion for COPD exacerbation as etiology of patient's dyspnea.  He does continue to smoke.  I do feel that she is stable for discharge at this time.  Patient has outpatient echo scheduled early afternoon.   Arby BarrettePfeiffer, Torry Istre, MD 02/24/18 [phone]

## 2018-02-24 NOTE — ED Notes (Signed)
Gave patient some water patient is resting with call bell in reach 

## 2018-02-24 NOTE — ED Notes (Signed)
Pt reports SOB x2 days. Pt reports she has a hx of CHF.

## 2018-02-24 NOTE — ED Notes (Signed)
Patient declined vital signs.

## 2018-02-27 ENCOUNTER — Other Ambulatory Visit (HOSPITAL_COMMUNITY): Payer: Medicare HMO

## 2018-02-28 ENCOUNTER — Ambulatory Visit (HOSPITAL_COMMUNITY): Payer: Medicare HMO | Attending: Cardiovascular Disease

## 2018-02-28 ENCOUNTER — Other Ambulatory Visit: Payer: Self-pay

## 2018-02-28 DIAGNOSIS — Z720 Tobacco use: Secondary | ICD-10-CM | POA: Diagnosis not present

## 2018-02-28 DIAGNOSIS — I252 Old myocardial infarction: Secondary | ICD-10-CM | POA: Diagnosis not present

## 2018-02-28 DIAGNOSIS — J449 Chronic obstructive pulmonary disease, unspecified: Secondary | ICD-10-CM | POA: Insufficient documentation

## 2018-02-28 DIAGNOSIS — I5042 Chronic combined systolic (congestive) and diastolic (congestive) heart failure: Secondary | ICD-10-CM

## 2018-02-28 DIAGNOSIS — I2510 Atherosclerotic heart disease of native coronary artery without angina pectoris: Secondary | ICD-10-CM | POA: Insufficient documentation

## 2018-02-28 DIAGNOSIS — I071 Rheumatic tricuspid insufficiency: Secondary | ICD-10-CM | POA: Insufficient documentation

## 2018-02-28 DIAGNOSIS — E785 Hyperlipidemia, unspecified: Secondary | ICD-10-CM | POA: Diagnosis not present

## 2018-02-28 DIAGNOSIS — I429 Cardiomyopathy, unspecified: Secondary | ICD-10-CM | POA: Insufficient documentation

## 2018-02-28 DIAGNOSIS — E669 Obesity, unspecified: Secondary | ICD-10-CM | POA: Diagnosis not present

## 2018-02-28 DIAGNOSIS — I110 Hypertensive heart disease with heart failure: Secondary | ICD-10-CM | POA: Insufficient documentation

## 2018-03-17 DIAGNOSIS — F3289 Other specified depressive episodes: Secondary | ICD-10-CM | POA: Diagnosis not present

## 2018-03-17 DIAGNOSIS — I509 Heart failure, unspecified: Secondary | ICD-10-CM | POA: Diagnosis not present

## 2018-03-17 DIAGNOSIS — J441 Chronic obstructive pulmonary disease with (acute) exacerbation: Secondary | ICD-10-CM | POA: Diagnosis not present

## 2018-03-17 DIAGNOSIS — M130 Polyarthritis, unspecified: Secondary | ICD-10-CM | POA: Diagnosis not present

## 2018-03-20 ENCOUNTER — Encounter: Payer: Self-pay | Admitting: Pharmacist

## 2018-03-20 ENCOUNTER — Ambulatory Visit (INDEPENDENT_AMBULATORY_CARE_PROVIDER_SITE_OTHER): Payer: Medicare HMO | Admitting: Pharmacist

## 2018-03-20 VITALS — BP 130/78

## 2018-03-20 DIAGNOSIS — I5042 Chronic combined systolic (congestive) and diastolic (congestive) heart failure: Secondary | ICD-10-CM

## 2018-03-20 MED ORDER — SACUBITRIL-VALSARTAN 49-51 MG PO TABS: 1.0000 | ORAL_TABLET | Freq: Two times a day (BID) | ORAL | 0 refills | Status: DC

## 2018-03-20 MED ORDER — HYDRALAZINE HCL 25 MG PO TABS: 12.5000 mg | ORAL_TABLET | Freq: Three times a day (TID) | ORAL | 0 refills | Status: DC

## 2018-03-20 NOTE — Patient Instructions (Addendum)
INCREASE Entresto to 49/51 mg TWICE daily   DECREASE hydralazine to 12.5mg  THREE times a day (take 1/2 tablet THREE times daily   CONTINUE all other medications as prescribed

## 2018-03-20 NOTE — Progress Notes (Signed)
Patient ID: Raven JockVanessa Liggett                 DOB: 13-Sep-1956                      MRN: [id number]     HPI: Raven Ross is a 62 y.o. female patient of Dr. Royann Shiversroitoru who presents today for medication management. PMH significant for severe cardiomyopathy, congestive heart failure, now roughly 2 months following implantation of a biventricular pacemaker/defibrillator for primary prevention. At her last visit with Dr. Royann Shiversroitoru, the importance of compliance was stressed and she was restarted on her carvedilol.   She presents today for additional medication titration and management. She has not yet taken her medications today. She denies dizziness, SOB, and chest pain. She does endorse some pain in her chest post-coughing, but she believes this is not related to her heart. She endorses missing the mid dose of her hydralazine at least 2-3 times per week, but states compliance with the rest of her medications.   Current HTN meds:  Carvedilol 25mg  BID (by noon, 10pm) Furosemide 40mg  daily am Hydralazine 25mg  TID (by noon, 2-3pm, 10pm) Isosorbide dinitrate 10mg  TID (by noon , 2-3pm, 10pm) Entresto 24/26mg  BID  BP goal: <130/80  Family History: COPD in her father and mother; Dementia in her mother; Diabetes in her father; Emphysema in her father.  Social History: former smoker, denies alcohol.   Diet: She admits that diet could be better. Most meals are from home. She does use a lot of dressing. She adds salt while cooking but not much at the table. She drinks a lot of water. She does drink V8 juice occasionally. She also has Pepsi on occasion.   Exercise: She has not been exercising. Her insurance covers for silver sneakers.   Home BP readings: Does not check blood pressure.   Wt Readings from Last 3 Encounters:  02/23/18 206 lb (93.4 kg)  12/31/17 211 lb (95.7 kg)   BP Readings from Last 3 Encounters:  03/20/18 130/78  02/24/18 (!) 151/96  12/31/17 138/88   Pulse Readings from Last 3  Encounters:  02/24/18 66  12/31/17 60    Renal function: CrCl cannot be calculated (Patient's most recent lab result is older than the maximum 21 days allowed.).  Past Medical History:  Diagnosis Date  . Arrhythmia   . CHF (congestive heart failure) (HCC)   . Coronary artery disease   . Hyperlipidemia   . Hypertension     Current Outpatient Medications on File Prior to Visit  Medication Sig Dispense Refill  . albuterol (PROVENTIL HFA;VENTOLIN HFA) 108 (90 Base) MCG/ACT inhaler Inhale 1-2 puffs into the lungs every 6 (six) hours as needed for wheezing or shortness of breath. 1 Inhaler 0  . aspirin EC 81 MG tablet Take 81 mg by mouth daily.    Marland Kitchen. atorvastatin (LIPITOR) 40 MG tablet Take 1 tablet (40 mg total) by mouth daily. 30 tablet 0  . budesonide-formoterol (SYMBICORT) 160-4.5 MCG/ACT inhaler Inhale 2 puffs into the lungs 2 (two) times daily.    . carvedilol (COREG) 25 MG tablet Take 1 tablet (25 mg total) by mouth 2 (two) times daily with a meal. 60 tablet 0  . furosemide (LASIX) 40 MG tablet Take 1 tablet (40 mg total) by mouth daily. 30 tablet 0  . ipratropium (ATROVENT HFA) 17 MCG/ACT inhaler Inhale 2 puffs into the lungs every 6 (six) hours as needed for wheezing. 1 Inhaler 12  . isosorbide  dinitrate (ISORDIL) 10 MG tablet TAKE 1 TABLET BY MOUTH THREE TIMES DAILY 270 tablet 1  . PROAIR HFA 108 (90 Base) MCG/ACT inhaler Inhale 1-2 puffs into the lungs every 4 (four) hours as needed for wheezing.     Marland Kitchen Spacer/Aero-Holding Chambers (AEROCHAMBER PLUS WITH MASK) inhaler Use as instructed 1 each 2   No current facility-administered medications on file prior to visit.     No Known Allergies  Blood pressure 130/78.   Assessment/Plan: Medication optimization: BP is ok today. She has not yet had medications today, but states her pressures run about the same after medications. Will have to be mindful that compliance is questionable, though she states compliance except for the midday  doses of isosorbide and hydralazine. Compliance was again reiterated with her today. Will plan to wean hydralazine slightly to allow increase in Entresto to 49/51mg  BID. Decrease hydralazine to 12.5mg  TID. Continue all other medications as prescribed. Follow up in pharmacy clinic in 2 weeks with pacer check for BP check and BMET.    Thank you, Freddie Apley. Cleatis Polka, PharmD  [facility]  03/20/2018 10:09 PM

## 2018-03-21 NOTE — Progress Notes (Signed)
Agree, Thanks MCr 

## 2018-04-01 ENCOUNTER — Telehealth: Payer: Self-pay | Admitting: Cardiovascular Disease

## 2018-04-01 NOTE — Telephone Encounter (Signed)
NEW MESSAGE   Patient returning call discuss echo. Please call [phone]

## 2018-04-01 NOTE — Telephone Encounter (Signed)
Notes recorded by Thurmon Fairroitoru, Mihai, MD on 03/01/2018 at 1:44 PM EDT Current LVEF 25-30% seems probably unchanged from studies in [place] (echo 20-25% and MUGA 26% last October) ______________________ Patient called w/echo results. She reports improvements in her breathing & swelling. She plans to f/up with pharmacy for entresto titration on 04/09/18. Stressed importance of med compliance, daily weights, sodium/salt restriction. Advised to call for acute concerns or weight gain of 3lbs in 24 hours or 5lbs in 1 week.

## 2018-04-09 ENCOUNTER — Ambulatory Visit (INDEPENDENT_AMBULATORY_CARE_PROVIDER_SITE_OTHER): Payer: Medicare HMO | Admitting: Pharmacist

## 2018-04-09 ENCOUNTER — Ambulatory Visit (INDEPENDENT_AMBULATORY_CARE_PROVIDER_SITE_OTHER): Payer: Medicare HMO | Admitting: *Deleted

## 2018-04-09 VITALS — BP 103/74 | HR 59

## 2018-04-09 DIAGNOSIS — I10 Essential (primary) hypertension: Secondary | ICD-10-CM | POA: Diagnosis not present

## 2018-04-09 DIAGNOSIS — I5042 Chronic combined systolic (congestive) and diastolic (congestive) heart failure: Secondary | ICD-10-CM

## 2018-04-09 DIAGNOSIS — Z720 Tobacco use: Secondary | ICD-10-CM | POA: Diagnosis not present

## 2018-04-09 LAB — CUP PACEART INCLINIC DEVICE CHECK
Brady Statistic RA Percent Paced: 18 %
HIGH POWER IMPEDANCE MEASURED VALUE: 69.75 Ohm
Implantable Pulse Generator Implant Date: 20181204
Lead Channel Impedance Value: 1112.5 Ohm
Lead Channel Impedance Value: 450 Ohm
Lead Channel Impedance Value: 475 Ohm
Lead Channel Pacing Threshold Amplitude: 0.75 V
Lead Channel Pacing Threshold Amplitude: 0.75 V
Lead Channel Pacing Threshold Amplitude: 2.875 V
Lead Channel Pacing Threshold Pulse Width: 0.5 ms
Lead Channel Pacing Threshold Pulse Width: 0.5 ms
Lead Channel Pacing Threshold Pulse Width: 0.5 ms
Lead Channel Pacing Threshold Pulse Width: 1 ms
Lead Channel Pacing Threshold Pulse Width: 1 ms
Lead Channel Sensing Intrinsic Amplitude: 12 mV
Lead Channel Setting Pacing Amplitude: 2 V
Lead Channel Setting Pacing Amplitude: 2 V
Lead Channel Setting Pacing Pulse Width: 0.5 ms
Lead Channel Setting Sensing Sensitivity: 0.5 mV
MDC IDC MSMT BATTERY REMAINING LONGEVITY: 85 mo
MDC IDC MSMT LEADCHNL LV PACING THRESHOLD AMPLITUDE: 2.75 V
MDC IDC MSMT LEADCHNL RA PACING THRESHOLD AMPLITUDE: 0.5 V
MDC IDC MSMT LEADCHNL RA PACING THRESHOLD AMPLITUDE: 0.5 V
MDC IDC MSMT LEADCHNL RA SENSING INTR AMPL: 5 mV
MDC IDC MSMT LEADCHNL RV PACING THRESHOLD PULSEWIDTH: 0.5 ms
MDC IDC SESS DTM: 20190424125138
MDC IDC STAT BRADY RV PERCENT PACED: 98 %
Pulse Gen Serial Number: 9795272

## 2018-04-09 LAB — BASIC METABOLIC PANEL
BUN / CREAT RATIO: 17 (ref 12–28)
BUN: 21 mg/dL (ref 8–27)
CO2: 23 mmol/L (ref 20–29)
CREATININE: 1.24 mg/dL — AB (ref 0.57–1.00)
Calcium: 9.2 mg/dL (ref 8.7–10.3)
Chloride: 104 mmol/L (ref 96–106)
GFR calc Af Amer: 55 mL/min/{1.73_m2} — ABNORMAL LOW (ref >59)
GFR, EST NON AFRICAN AMERICAN: 47 mL/min/{1.73_m2} — AB (ref >59)
Glucose: 119 mg/dL — ABNORMAL HIGH (ref 65–99)
Potassium: 4.2 mmol/L (ref 3.5–5.2)
Sodium: 142 mmol/L (ref 134–144)

## 2018-04-09 MED ORDER — SACUBITRIL-VALSARTAN 49-51 MG PO TABS: 1.0000 | ORAL_TABLET | Freq: Two times a day (BID) | ORAL | 11 refills | Status: DC

## 2018-04-09 MED ORDER — NICOTINE POLACRILEX 2 MG MT GUM: 2.0000 mg | CHEWING_GUM | OROMUCOSAL | 0 refills | Status: AC | PRN

## 2018-04-09 NOTE — Progress Notes (Signed)
Patient ID: Raven JockVanessa Scaduto                 DOB: 1956-05-01                      MRN: [id number]     HPI: Raven Ross is a 62 y.o. female patient of Dr. Royann Shiversroitoru who presents today for medication management. PMH significant for severe cardiomyopathy, MI in 2002, LV apical thrombus, multiple TIAs, Hepatitis C, congestive heart failure with LVEF 25-30% on 02/28/18 echo, now roughly 3 months following implantation of a biventricular pacemaker/defibrillator for primary prevention. At her last visit, medication compliance was stressed as patient has been missing her mid day dose of hydralazine and isosorbide. Her Entresto was increased to 49-51mg  BID and hydralazine was decreased to 12.5mg  TID.  Patient presents today in good spirits. She reports running out of her Entresto samples and has missed her last 2 doses. Looks as though rx was sent to mail order for 1 month supply a few weeks ago, however pt reports she did not receive this. She denies dizziness, blurred vision, falls, or headache. Reports compliance with medications. Some chest tightness this morning, improved with inhaler use, exacerbated by cigarette use. She is currently smoking 7 cigarettes per day. Previously has tried nicotine gum and patch with success. She has checked her BP at local pharmacies twice since last visit with readings of 116/76 and 130/84. She reports she is permanently moving back to [place] this week.  Current HTN meds:  Carvedilol 25mg  BID (by noon, 10pm) Furosemide 40mg  daily am Hydralazine 12.5mg  TID (by noon, 2-3pm, 10pm) Isosorbide dinitrate 10mg  TID (by noon , 2-3pm, 10pm) Entresto 49-51mg  BID  BP goal: <130/2680mmHg  Family History: COPD in her father and mother; Dementia in her mother; Diabetes in her father; Emphysema in her father.  Social History: Former smoker, denies alcohol. Retired Engineer, civil (consulting)nurse.  Diet: She admits that diet could be better. Most meals are from home. She does use a lot of dressing. She adds  salt while cooking but not much at the table. She drinks a lot of water. She does drink V8 juice occasionally. She also has Pepsi on occasion.   Exercise: She has not been exercising. Her insurance covers for silver sneakers.   Home BP readings: 116/76, 130/84  Wt Readings from Last 3 Encounters:  02/23/18 206 lb (93.4 kg)  12/31/17 211 lb (95.7 kg)   BP Readings from Last 3 Encounters:  03/20/18 130/78  02/24/18 (!) 151/96  12/31/17 138/88   Pulse Readings from Last 3 Encounters:  02/24/18 66  12/31/17 60    Renal function: CrCl cannot be calculated (Patient's most recent lab result is older than the maximum 21 days allowed.).  Past Medical History:  Diagnosis Date  . Arrhythmia   . CHF (congestive heart failure) (HCC)   . Coronary artery disease   . Hyperlipidemia   . Hypertension     Current Outpatient Medications on File Prior to Visit  Medication Sig Dispense Refill  . albuterol (PROVENTIL HFA;VENTOLIN HFA) 108 (90 Base) MCG/ACT inhaler Inhale 1-2 puffs into the lungs every 6 (six) hours as needed for wheezing or shortness of breath. 1 Inhaler 0  . aspirin EC 81 MG tablet Take 81 mg by mouth daily.    Marland Kitchen. atorvastatin (LIPITOR) 40 MG tablet Take 1 tablet (40 mg total) by mouth daily. 30 tablet 0  . budesonide-formoterol (SYMBICORT) 160-4.5 MCG/ACT inhaler Inhale 2 puffs into the lungs 2 (two)  times daily.    . carvedilol (COREG) 25 MG tablet Take 1 tablet (25 mg total) by mouth 2 (two) times daily with a meal. 60 tablet 0  . furosemide (LASIX) 40 MG tablet Take 1 tablet (40 mg total) by mouth daily. 30 tablet 0  . hydrALAZINE (APRESOLINE) 25 MG tablet Take 0.5 tablets (12.5 mg total) by mouth 3 (three) times daily. 90 tablet 0  . ipratropium (ATROVENT HFA) 17 MCG/ACT inhaler Inhale 2 puffs into the lungs every 6 (six) hours as needed for wheezing. 1 Inhaler 12  . isosorbide dinitrate (ISORDIL) 10 MG tablet TAKE 1 TABLET BY MOUTH THREE TIMES DAILY 270 tablet 1  . PROAIR  HFA 108 (90 Base) MCG/ACT inhaler Inhale 1-2 puffs into the lungs every 4 (four) hours as needed for wheezing.     . sacubitril-valsartan (ENTRESTO) 49-51 MG Take 1 tablet by mouth 2 (two) times daily. 60 tablet 0  . Spacer/Aero-Holding Chambers (AEROCHAMBER PLUS WITH MASK) inhaler Use as instructed 1 each 2   No current facility-administered medications on file prior to visit.     No Known Allergies  There were no vitals taken for this visit.   Assessment/Plan:  1. Hypertension/HF medication optimization - BP a bit low today at 103/74, however home readings have been higher in the 110-130 systolic. Pt is feeling well and denies symptoms of hypotension. Will continue carvedilol 25mg  BID, Entresto 49-51mg  BID, furosemide 40mg  daily, hydralazine 12.5mg  TID, and isosorbide dinitrate 10mg  TID. Unable to add spironolactone at today's visit due to low BP. Will check BMET with recent Entresto dose titration.   2. Tobacco abuse - Pt currently smoking 7 cigarettes per day and is interested in quitting. Sent in rx for nicotine gum 2mg  prn to help with cravings as this has previously worked well for patient.  Pt is moving back to [place], [place] this weekend and will re-establish with her old cardiologist for future management. Provided her with 2 week sample supply of Entresto and sent new rx to local pharmacy for pt to pick up before she leaves.   Melissa Pulido E. Therisa Mennella, PharmD, CPP, BCACP [facility] [address] [place], [place] [address] Phone: [phone]; Fax: [phone] 04/09/2018 11:52 AM

## 2018-04-09 NOTE — Patient Instructions (Addendum)
Continue taking your current medications  We will check your labs today  Monitor your blood pressure and call clinic if your top blood pressure reading stays below 100 or if you become dizzy  Pick up nicotine gum to help with tobacco cessation

## 2018-04-09 NOTE — Progress Notes (Signed)
CRT-D device check in office. RA/RV thresholds and sensing consistent with previous device measurements. Lead impedance trends stable over time. LV threshold testing was performed, capture noted in (3) vectors only - D1-RVc, D1-P4, D1-M2. LV pacing configuration was then changed to D1-P4. VV optimization was performed via EKG - underlying QRS ranged from 102-11416ms depending on rate, whereas LV->RV (0-1640ms) QRS ranged from 134-16936ms. Findings were discussed with Dr.Croitoru via telephone, recommendations were to reprogram device to RV only pacing with PAVD/SAVD 300/23450ms to promote intrinsic conduction. Patient made aware of changes. No mode switch episodes recorded. (1) VT-NS episode recorded x 8sec. Patient bi-ventricularly pacing 98% of the time (RV only d/t lack of LV capture). Device programmed with appropriate safety margins. Heart failure diagnostics reviewed and trends are stable for patient. Vibratory alerts demonstrated for patient.  Estimated longevity 7 years (after changes).  Patient enrolled in remote follow up. Plan to follow up via [facility] on 7/24 and [facility], as patient is relocating to [place] on 4/27. Merlin monitor paired this ov. Patient education completed including shock plan and remote monitoring.

## 2018-04-10 ENCOUNTER — Other Ambulatory Visit: Payer: Self-pay | Admitting: Pharmacist

## 2018-04-10 MED ORDER — SACUBITRIL-VALSARTAN 24-26 MG PO TABS: 1.0000 | ORAL_TABLET | Freq: Two times a day (BID) | ORAL | 11 refills | Status: AC

## 2018-04-10 MED ORDER — HYDRALAZINE HCL 25 MG PO TABS: 25.0000 mg | ORAL_TABLET | Freq: Three times a day (TID) | ORAL | 11 refills | Status: AC

## 2018-04-16 ENCOUNTER — Other Ambulatory Visit: Payer: Self-pay

## 2018-04-17 ENCOUNTER — Telehealth: Payer: Self-pay | Admitting: Cardiovascular Disease

## 2018-04-17 NOTE — Telephone Encounter (Signed)
New Message    [place] with [employer] is calling in reference to prior authorization [employer]. Please contact [employer] using reference key [id number].

## 2018-04-17 NOTE — Telephone Encounter (Signed)
Routed to CMA to address PA

## 2018-04-21 NOTE — Telephone Encounter (Signed)
Prior authorization for [facility] faxed to [employer] via [email or web address] using the key below. Awaiting approval.

## 2018-04-23 NOTE — Telephone Encounter (Signed)
Prior authorization for [facility] has been approved through 04/21/2020.

## 2018-05-08 ENCOUNTER — Other Ambulatory Visit (HOSPITAL_COMMUNITY): Payer: Self-pay | Admitting: Respiratory Therapy

## 2018-05-08 DIAGNOSIS — J441 Chronic obstructive pulmonary disease with (acute) exacerbation: Secondary | ICD-10-CM

## 2018-07-09 ENCOUNTER — Ambulatory Visit (INDEPENDENT_AMBULATORY_CARE_PROVIDER_SITE_OTHER): Payer: 59 | Admitting: *Deleted

## 2018-07-09 DIAGNOSIS — I5042 Chronic combined systolic (congestive) and diastolic (congestive) heart failure: Secondary | ICD-10-CM | POA: Diagnosis not present

## 2018-07-09 NOTE — Progress Notes (Signed)
Remote ICD transmission.   

## 2018-07-10 ENCOUNTER — Telehealth: Payer: Self-pay | Admitting: Cardiology

## 2018-07-10 NOTE — Telephone Encounter (Signed)
LMOVM for pt to return call in regards to release remote monitoring profile.

## 2018-07-11 NOTE — Telephone Encounter (Signed)
2nd attempt.  LM for pt to return call.

## 2018-07-17 NOTE — Telephone Encounter (Signed)
3rd attempt  LMOVM for pt to return call in regards to releasing merlin profile in remote monitoring web site.

## 2018-08-18 LAB — CUP PACEART REMOTE DEVICE CHECK
Battery Remaining Longevity: 87 mo
Battery Remaining Percentage: 89 %
Battery Voltage: 3.13 V
Brady Statistic AS VP Percent: 1 %
Brady Statistic RA Percent Paced: 36 %
Brady Statistic RV Percent Paced: 1 %
HIGH POWER IMPEDANCE MEASURED VALUE: 61 Ohm
HighPow Impedance: 61 Ohm
Implantable Pulse Generator Implant Date: 20181204
Lead Channel Impedance Value: 390 Ohm
Lead Channel Impedance Value: 440 Ohm
Lead Channel Pacing Threshold Amplitude: 0.5 V
Lead Channel Pacing Threshold Amplitude: 0.75 V
Lead Channel Sensing Intrinsic Amplitude: 12 mV
Lead Channel Sensing Intrinsic Amplitude: 5 mV
Lead Channel Setting Sensing Sensitivity: 0.5 mV
MDC IDC MSMT LEADCHNL RA PACING THRESHOLD PULSEWIDTH: 0.5 ms
MDC IDC MSMT LEADCHNL RV PACING THRESHOLD PULSEWIDTH: 0.5 ms
MDC IDC SESS DTM: 20190724060017
MDC IDC SET LEADCHNL RA PACING AMPLITUDE: 2 V
MDC IDC SET LEADCHNL RV PACING AMPLITUDE: 2 V
MDC IDC SET LEADCHNL RV PACING PULSEWIDTH: 0.5 ms
MDC IDC STAT BRADY AP VP PERCENT: 1 %
MDC IDC STAT BRADY AP VS PERCENT: 39 %
MDC IDC STAT BRADY AS VS PERCENT: 58 %
Pulse Gen Serial Number: 9795272

## 2018-08-28 IMAGING — DX DG CHEST 2V
2 series · 2 of 2 positions shown · non-contrast
Comparison: None.

CLINICAL DATA: Acute onset of productive cough. Shortness of
breath. Abdominal distention.

EXAM:
CHEST - 2 VIEW

[chest lat]
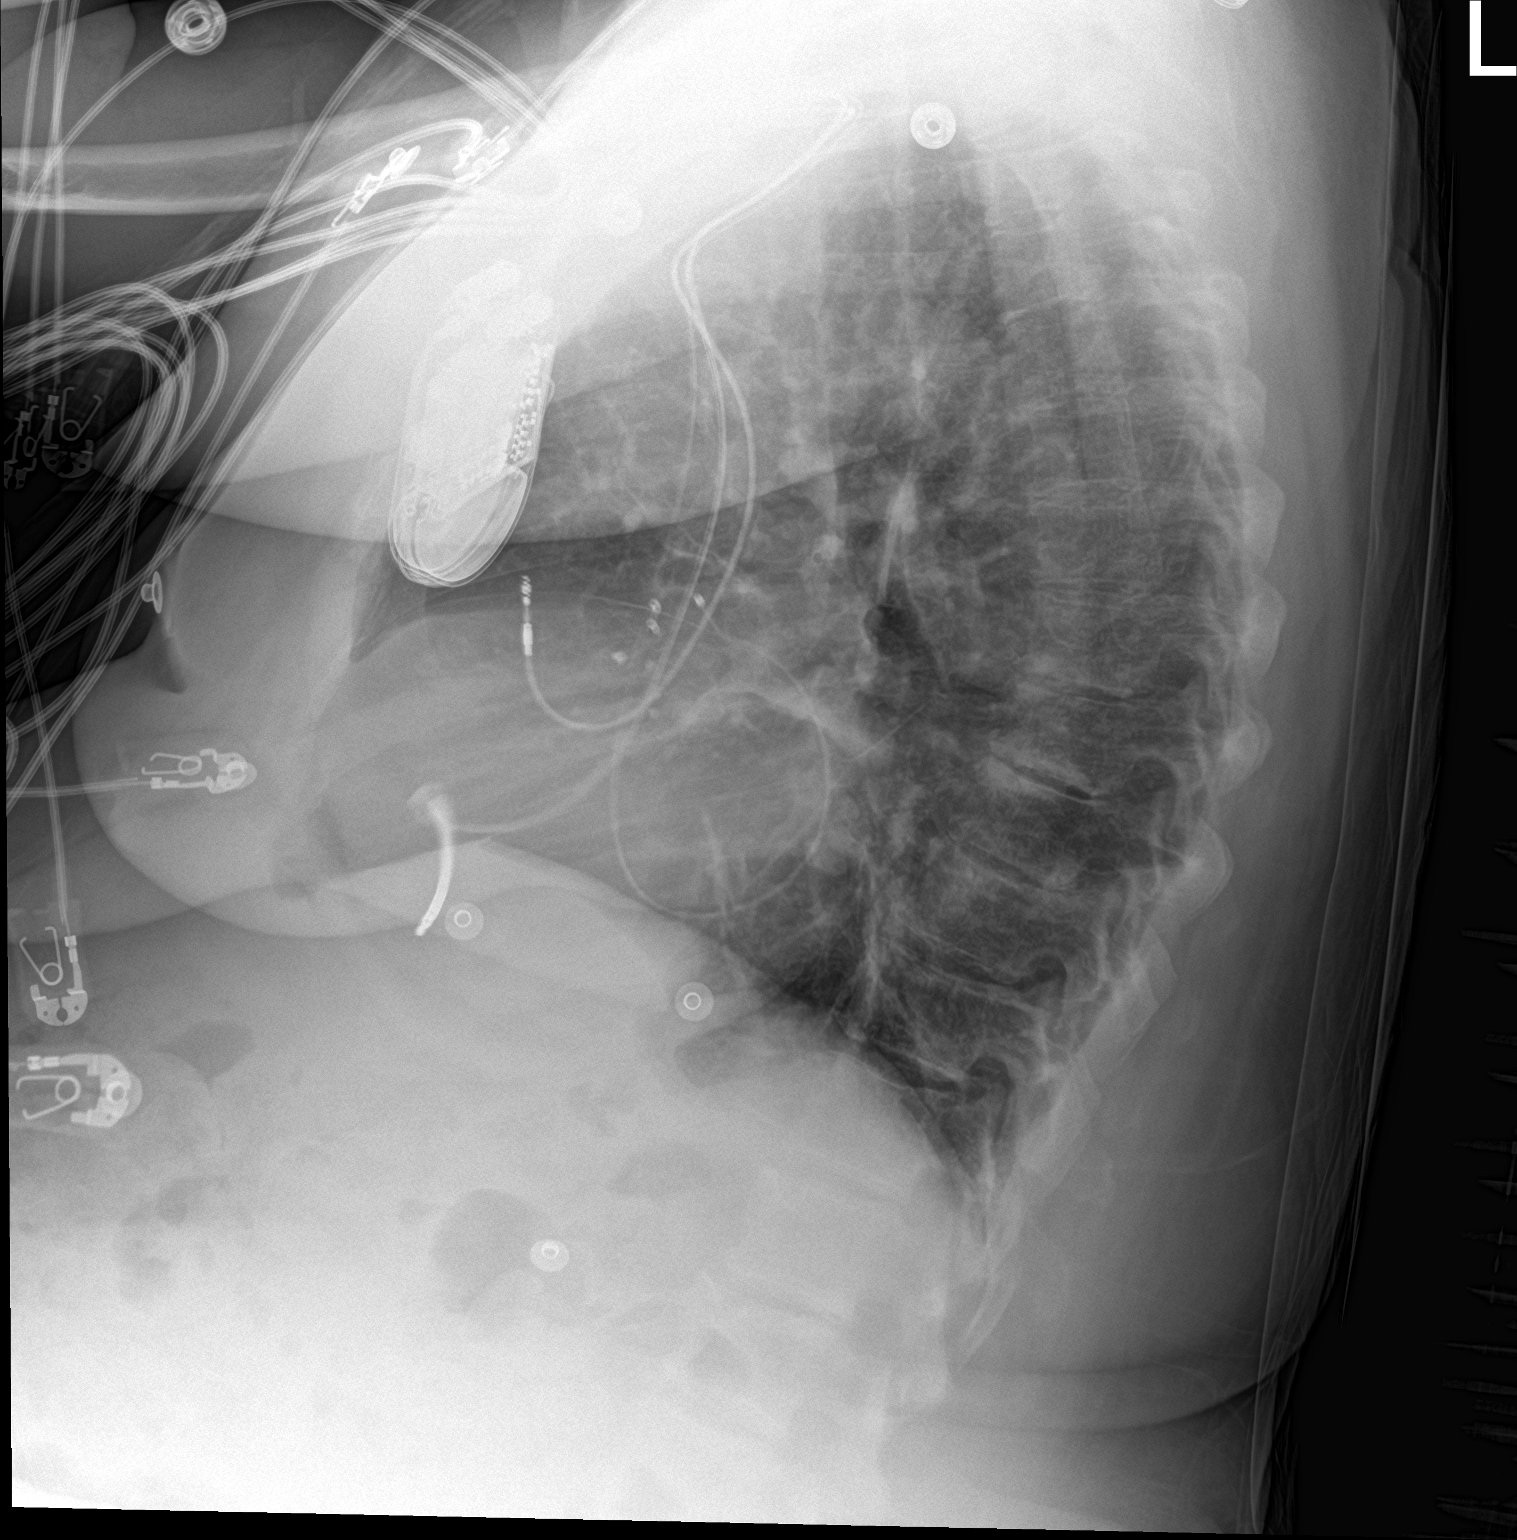

[chest ap]
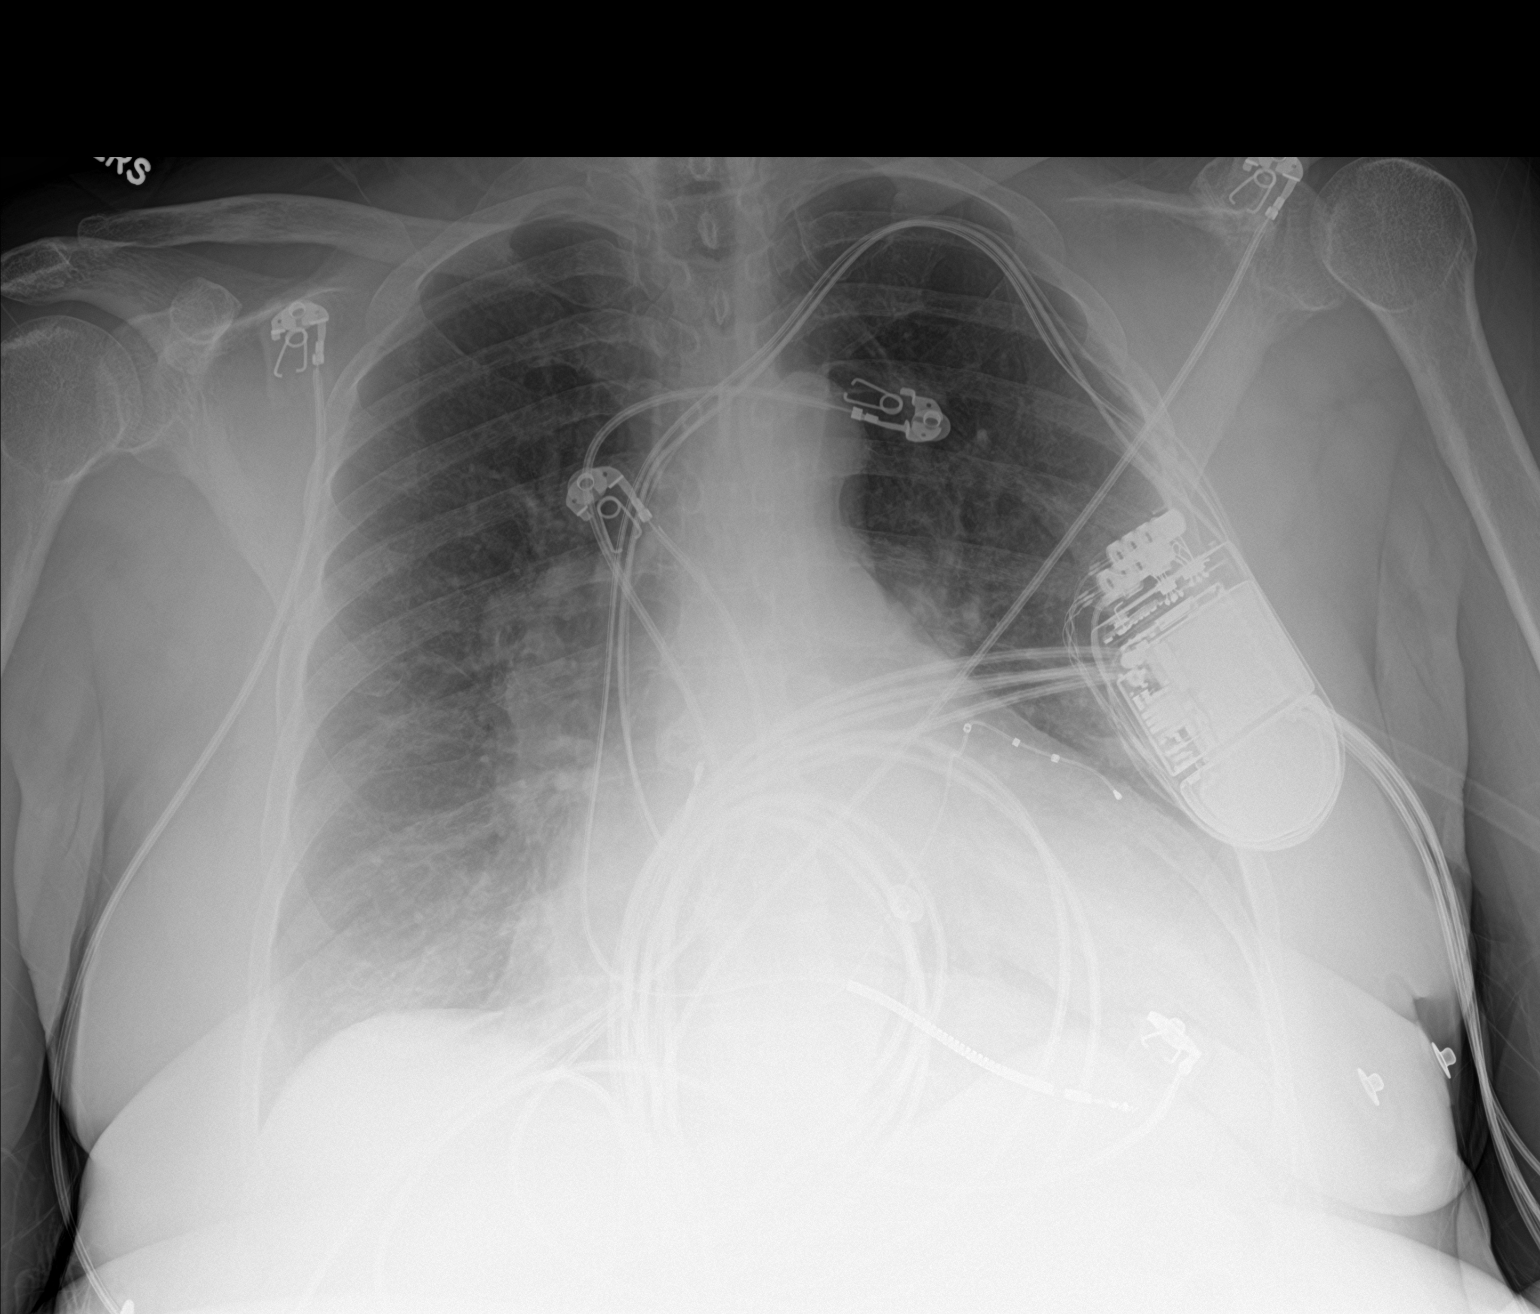

[2 of 2 positions shown; findings below may reference images not displayed]

FINDINGS: The lungs are well-aerated and clear. There is no evidence of focal
opacification, pleural effusion or pneumothorax.

The heart is mildly enlarged. A pacemaker/AICD is noted at the left
chest wall, with leads ending at the right atrium, right ventricle
and coronary sinus. No acute osseous abnormalities are seen.
IMPRESSION: Mild cardiomegaly.  Lungs remain grossly clear.

## 2018-10-08 ENCOUNTER — Telehealth: Payer: Self-pay

## 2018-10-08 ENCOUNTER — Ambulatory Visit (INDEPENDENT_AMBULATORY_CARE_PROVIDER_SITE_OTHER): Payer: Self-pay | Admitting: *Deleted

## 2018-10-08 DIAGNOSIS — I5042 Chronic combined systolic (congestive) and diastolic (congestive) heart failure: Secondary | ICD-10-CM

## 2018-10-08 NOTE — Telephone Encounter (Signed)
LMOVM reminding pt to send remote transmission.   

## 2018-10-09 ENCOUNTER — Encounter: Payer: Self-pay | Admitting: Cardiology

## 2018-10-09 NOTE — Progress Notes (Signed)
Remote ICD transmission.   

## 2018-11-15 ENCOUNTER — Other Ambulatory Visit: Payer: Self-pay | Admitting: Cardiovascular Disease

## 2018-11-18 NOTE — Telephone Encounter (Signed)
Rx sent to pharmacy   

## 2018-12-13 LAB — CUP PACEART REMOTE DEVICE CHECK
Battery Remaining Longevity: 86 mo
Battery Remaining Percentage: 87 %
Battery Voltage: 3.07 V
Brady Statistic AP VP Percent: 1 %
Brady Statistic AS VS Percent: 72 %
Brady Statistic RA Percent Paced: 25 %
Date Time Interrogation Session: 20191023180154
HIGH POWER IMPEDANCE MEASURED VALUE: 57 Ohm
HighPow Impedance: 57 Ohm
Implantable Pulse Generator Implant Date: 20181204
Lead Channel Impedance Value: 410 Ohm
Lead Channel Pacing Threshold Pulse Width: 0.5 ms
Lead Channel Sensing Intrinsic Amplitude: 12 mV
Lead Channel Setting Pacing Amplitude: 2 V
Lead Channel Setting Pacing Amplitude: 2 V
Lead Channel Setting Sensing Sensitivity: 0.5 mV
MDC IDC MSMT LEADCHNL RA IMPEDANCE VALUE: 460 Ohm
MDC IDC MSMT LEADCHNL RA PACING THRESHOLD AMPLITUDE: 0.5 V
MDC IDC MSMT LEADCHNL RA SENSING INTR AMPL: 5 mV
MDC IDC MSMT LEADCHNL RV PACING THRESHOLD AMPLITUDE: 0.75 V
MDC IDC MSMT LEADCHNL RV PACING THRESHOLD PULSEWIDTH: 0.5 ms
MDC IDC SET LEADCHNL RV PACING PULSEWIDTH: 0.5 ms
MDC IDC STAT BRADY AP VS PERCENT: 27 %
MDC IDC STAT BRADY AS VP PERCENT: 1 %
MDC IDC STAT BRADY RV PERCENT PACED: 1 %
Pulse Gen Serial Number: 9795272

## 2019-01-07 ENCOUNTER — Ambulatory Visit (INDEPENDENT_AMBULATORY_CARE_PROVIDER_SITE_OTHER): Payer: Medicare Other

## 2019-01-07 DIAGNOSIS — I5042 Chronic combined systolic (congestive) and diastolic (congestive) heart failure: Secondary | ICD-10-CM | POA: Diagnosis not present

## 2019-01-07 DIAGNOSIS — I10 Essential (primary) hypertension: Secondary | ICD-10-CM

## 2019-01-08 LAB — CUP PACEART REMOTE DEVICE CHECK
Battery Voltage: 3.04 V
Brady Statistic AP VP Percent: 1 %
Brady Statistic AP VS Percent: 24 %
Brady Statistic AS VP Percent: 1 %
Brady Statistic RA Percent Paced: 23 %
Brady Statistic RV Percent Paced: 1 %
Date Time Interrogation Session: 20200122070017
HighPow Impedance: 52 Ohm
HighPow Impedance: 52 Ohm
Lead Channel Impedance Value: 440 Ohm
Lead Channel Pacing Threshold Amplitude: 0.5 V
Lead Channel Pacing Threshold Amplitude: 0.75 V
Lead Channel Pacing Threshold Pulse Width: 0.5 ms
Lead Channel Pacing Threshold Pulse Width: 0.5 ms
Lead Channel Sensing Intrinsic Amplitude: 12 mV
Lead Channel Sensing Intrinsic Amplitude: 3.2 mV
Lead Channel Setting Sensing Sensitivity: 0.5 mV
MDC IDC MSMT BATTERY REMAINING LONGEVITY: 84 mo
MDC IDC MSMT BATTERY REMAINING PERCENTAGE: 85 %
MDC IDC MSMT LEADCHNL RV IMPEDANCE VALUE: 400 Ohm
MDC IDC PG IMPLANT DT: 20181204
MDC IDC PG SERIAL: 9795272
MDC IDC SET LEADCHNL RA PACING AMPLITUDE: 2 V
MDC IDC SET LEADCHNL RV PACING AMPLITUDE: 2 V
MDC IDC SET LEADCHNL RV PACING PULSEWIDTH: 0.5 ms
MDC IDC STAT BRADY AS VS PERCENT: 75 %

## 2019-01-08 NOTE — Progress Notes (Signed)
Remote ICD transmission.   

## 2019-01-09 ENCOUNTER — Encounter: Payer: Self-pay | Admitting: Cardiology

## 2019-04-08 ENCOUNTER — Other Ambulatory Visit: Payer: Self-pay

## 2019-04-08 ENCOUNTER — Ambulatory Visit (INDEPENDENT_AMBULATORY_CARE_PROVIDER_SITE_OTHER): Payer: Medicare (Managed Care) | Admitting: *Deleted

## 2019-04-08 DIAGNOSIS — Z95810 Presence of automatic (implantable) cardiac defibrillator: Secondary | ICD-10-CM

## 2019-04-08 DIAGNOSIS — I5042 Chronic combined systolic (congestive) and diastolic (congestive) heart failure: Secondary | ICD-10-CM | POA: Diagnosis not present

## 2019-04-08 LAB — CUP PACEART REMOTE DEVICE CHECK
Battery Remaining Longevity: 81 mo
Battery Remaining Percentage: 82 %
Battery Voltage: 3.02 V
Brady Statistic AP VP Percent: 1 %
Brady Statistic AP VS Percent: 25 %
Brady Statistic AS VP Percent: 1 %
Brady Statistic AS VS Percent: 74 %
Brady Statistic RA Percent Paced: 24 %
Brady Statistic RV Percent Paced: 1 %
Date Time Interrogation Session: 20200422060016
HighPow Impedance: 59 Ohm
HighPow Impedance: 59 Ohm
Implantable Pulse Generator Implant Date: 20181204
Lead Channel Impedance Value: 390 Ohm
Lead Channel Impedance Value: 410 Ohm
Lead Channel Pacing Threshold Amplitude: 0.625 V
Lead Channel Pacing Threshold Amplitude: 0.875 V
Lead Channel Pacing Threshold Pulse Width: 0.5 ms
Lead Channel Pacing Threshold Pulse Width: 0.5 ms
Lead Channel Sensing Intrinsic Amplitude: 12 mV
Lead Channel Sensing Intrinsic Amplitude: 4.7 mV
Lead Channel Setting Pacing Amplitude: 2 V
Lead Channel Setting Pacing Amplitude: 2 V
Lead Channel Setting Pacing Pulse Width: 0.5 ms
Lead Channel Setting Sensing Sensitivity: 0.5 mV
Pulse Gen Serial Number: 9795272

## 2019-04-16 ENCOUNTER — Encounter: Payer: Self-pay | Admitting: Cardiology

## 2019-04-16 NOTE — Progress Notes (Signed)
Remote ICD transmission.   

## 2019-04-17 ENCOUNTER — Other Ambulatory Visit: Payer: Self-pay | Admitting: Cardiovascular Disease

## 2019-04-17 NOTE — Telephone Encounter (Signed)
Carvedilol 25 mg refilled. 

## 2019-07-08 ENCOUNTER — Encounter: Payer: Medicare (Managed Care) | Admitting: *Deleted

## 2019-07-09 ENCOUNTER — Telehealth: Payer: Self-pay

## 2019-07-09 NOTE — Telephone Encounter (Signed)
Left message for patient to remind of missed remote transmission.
